# Patient Record
Sex: Female | Born: 1980 | Race: White | Hispanic: No | Marital: Married | Smoking: Never smoker
Health system: Southern US, Community
[De-identification: ages and names within clinical notes are randomized; demographics above are authoritative.]

## PROBLEM LIST (undated history)

## (undated) DIAGNOSIS — IMO0002 Reserved for concepts with insufficient information to code with codable children: Secondary | ICD-10-CM

## (undated) DIAGNOSIS — F419 Anxiety disorder, unspecified: Secondary | ICD-10-CM

## (undated) DIAGNOSIS — C649 Malignant neoplasm of unspecified kidney, except renal pelvis: Secondary | ICD-10-CM

## (undated) DIAGNOSIS — R87619 Unspecified abnormal cytological findings in specimens from cervix uteri: Secondary | ICD-10-CM

## (undated) HISTORY — PX: KIDNEY SURGERY: SHX687

## (undated) HISTORY — DX: Reserved for concepts with insufficient information to code with codable children: IMO0002

## (undated) HISTORY — DX: Malignant neoplasm of unspecified kidney, except renal pelvis: C64.9

## (undated) HISTORY — DX: Unspecified abnormal cytological findings in specimens from cervix uteri: R87.619

---

## 2010-07-30 NOTE — L&D Delivery Note (Signed)
Delivery Note  At 6:59 AM a viable female was delivered via Vaginal, Spontaneous Delivery (Presentation:oa  ;  ).  APGAR:8/9, ; weight .   Placenta status:INTACT , 3 VESSEL.  Cord:  with the following complications: NOINE  Anesthesia: EPIDURAL  Episiotomy:NONE  Lacerations:NONE  Est. Blood Loss (mL): 500  Mom to postpartum.  Baby to nursery-stable.  Kensley Lares S 07/18/2011, 7:10 AM

## 2010-12-27 LAB — HIV ANTIBODY (ROUTINE TESTING W REFLEX): HIV: NONREACTIVE

## 2010-12-27 LAB — HEPATITIS B SURFACE ANTIGEN: Hepatitis B Surface Ag: NEGATIVE

## 2010-12-27 LAB — ABO/RH
RH Type: NEGATIVE
RH Type: POSITIVE

## 2010-12-27 LAB — RUBELLA ANTIBODY, IGM: Rubella: IMMUNE

## 2010-12-27 LAB — GC/CHLAMYDIA PROBE AMP, GENITAL: Gonorrhea: NEGATIVE

## 2011-06-18 ENCOUNTER — Ambulatory Visit: Payer: Self-pay | Admitting: Dietician

## 2011-07-13 ENCOUNTER — Encounter (HOSPITAL_COMMUNITY): Payer: Self-pay | Admitting: *Deleted

## 2011-07-13 ENCOUNTER — Telehealth (HOSPITAL_COMMUNITY): Payer: Self-pay | Admitting: *Deleted

## 2011-07-13 NOTE — Telephone Encounter (Signed)
Preadmission screen  

## 2011-07-16 ENCOUNTER — Telehealth (HOSPITAL_COMMUNITY): Payer: Self-pay | Admitting: *Deleted

## 2011-07-16 NOTE — Telephone Encounter (Signed)
Preadmission screen  

## 2011-07-17 ENCOUNTER — Encounter (HOSPITAL_COMMUNITY): Payer: Self-pay

## 2011-07-17 ENCOUNTER — Inpatient Hospital Stay (HOSPITAL_COMMUNITY)
Admission: RE | Admit: 2011-07-17 | Discharge: 2011-07-20 | DRG: 372 | Disposition: A | Discharge status: Home / Self Care | Payer: BC Managed Care – PPO | Source: Ambulatory Visit | Attending: Obstetrics and Gynecology | Admitting: Obstetrics and Gynecology

## 2011-07-17 DIAGNOSIS — O139 Gestational [pregnancy-induced] hypertension without significant proteinuria, unspecified trimester: Principal | ICD-10-CM | POA: Diagnosis present

## 2011-07-17 LAB — CBC
MCH: 28.6 pg (ref 26.0–34.0)
MCHC: 33.2 g/dL (ref 30.0–36.0)
Platelets: 196 10*3/uL (ref 150–400)

## 2011-07-17 LAB — COMPREHENSIVE METABOLIC PANEL
AST: 18 U/L (ref 0–37)
Albumin: 2.7 g/dL — ABNORMAL LOW (ref 3.5–5.2)
CO2: 21 mEq/L (ref 19–32)
Calcium: 8.7 mg/dL (ref 8.4–10.5)
Creatinine, Ser: 0.76 mg/dL (ref 0.50–1.10)
GFR calc non Af Amer: >90 mL/min (ref >90)

## 2011-07-17 MED ORDER — OXYTOCIN BOLUS FROM INFUSION
500.0000 mL | Freq: Once | INTRAVENOUS | Status: DC
  Filled 2011-07-17: qty 500
  Filled 2011-07-17: qty 1000

## 2011-07-17 MED ORDER — TERBUTALINE SULFATE 1 MG/ML IJ SOLN: 0.2500 mg | Freq: Once | INTRAMUSCULAR | Status: AC | PRN

## 2011-07-17 MED ORDER — FLEET ENEMA 7-19 GM/118ML RE ENEM: 1.0000 | ENEMA | RECTAL | Status: DC | PRN

## 2011-07-17 MED ORDER — ZOLPIDEM TARTRATE 10 MG PO TABS: 10.0000 mg | ORAL_TABLET | Freq: Every evening | ORAL | Status: DC | PRN

## 2011-07-17 MED ORDER — LACTATED RINGERS IV SOLN: 500.0000 mL | INTRAVENOUS | Status: DC | PRN

## 2011-07-17 MED ORDER — OXYTOCIN 20 UNITS IN LACTATED RINGERS INFUSION - SIMPLE: 125.0000 mL/h | Freq: Once | INTRAVENOUS | Status: DC

## 2011-07-17 MED ORDER — LACTATED RINGERS IV SOLN
INTRAVENOUS | Status: DC
  Administered 2011-07-17 – 2011-07-18 (×2): via INTRAVENOUS

## 2011-07-17 MED ORDER — OXYCODONE-ACETAMINOPHEN 5-325 MG PO TABS: 2.0000 | ORAL_TABLET | ORAL | Status: DC | PRN

## 2011-07-17 MED ORDER — CITRIC ACID-SODIUM CITRATE 334-500 MG/5ML PO SOLN: 30.0000 mL | ORAL | Status: DC | PRN

## 2011-07-17 MED ORDER — LIDOCAINE HCL (PF) 1 % IJ SOLN
30.0000 mL | INTRAMUSCULAR | Status: DC | PRN
  Filled 2011-07-17: qty 30

## 2011-07-17 MED ORDER — ACETAMINOPHEN 325 MG PO TABS: 650.0000 mg | ORAL_TABLET | ORAL | Status: DC | PRN

## 2011-07-17 MED ORDER — DINOPROSTONE 10 MG VA INST
10.0000 mg | VAGINAL_INSERT | Freq: Once | VAGINAL | Status: AC
  Administered 2011-07-17: 10 mg via VAGINAL
  Filled 2011-07-17: qty 1

## 2011-07-17 MED ORDER — IBUPROFEN 600 MG PO TABS: 600.0000 mg | ORAL_TABLET | Freq: Four times a day (QID) | ORAL | Status: DC | PRN

## 2011-07-17 MED ORDER — ONDANSETRON HCL 4 MG/2ML IJ SOLN
4.0000 mg | Freq: Four times a day (QID) | INTRAMUSCULAR | Status: DC | PRN
  Administered 2011-07-18: 4 mg via INTRAVENOUS
  Filled 2011-07-17: qty 2

## 2011-07-17 NOTE — H&P (Signed)
Madison Webster is a 30 y.o. female presenting for induction at 37.6.  Elevated blood pressures in office. 150/100 and 148/98 with 2+ protien. PIH labs ok.  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  Episode of SBO with this pregnancy.  Neg GBS History OB History    Grav Para Term Preterm Abortions TAB SAB Ect Mult Living   2 1 1       1      Past Medical History  Diagnosis Date  . Abnormal Pap smear     HPV  . 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 Kidney surgery     L kidney removal   Family History: family history includes Cancer in her mother and Diabetes in her father and paternal grandfather. Social History:  reports that she has never smoked. She has never used smokeless tobacco. She reports that she does not drink alcohol or use illicit drugs.  ROS  Dilation: 2 Effacement (%): 50 Station: -2 Blood pressure 146/103, pulse 68, temperature 98 F (36.7 C), temperature source Oral, resp. rate 18, height 5\' 5"  (1.651 m), weight 73.483 kg (162 lb), last menstrual period 10/26/2010. Maternal Exam:  Uterine Assessment: Contraction strength is mild.  Contraction frequency is irregular.   Abdomen: Patient reports no abdominal tenderness. Fundal height is c/w dates.   Estimated fetal weight is 6+.   Fetal presentation: vertex  Pelvis: adequate for delivery.   Cervix: Cervix evaluated by digital exam.     Physical Exam  Prenatal labs: ABO, Rh: O/Negative/-- (05/30 0000) Antibody: Negative (05/30 0000) Rubella: Immune (05/30 0000) RPR: Nonreactive (05/30 0000)  HBsAg: Negative (05/30 0000)  HIV: Non-reactive (05/30 0000)  GBS: Negative (12/05 0000)   Assessment/Plan: IUP at 37.6 with PIH.  For induction   Torra Pala S 07/17/2011, 8:43 PM

## 2011-07-18 ENCOUNTER — Encounter (HOSPITAL_COMMUNITY): Payer: Self-pay | Admitting: Anesthesiology

## 2011-07-18 ENCOUNTER — Inpatient Hospital Stay (HOSPITAL_COMMUNITY): Payer: BC Managed Care – PPO | Admitting: Anesthesiology

## 2011-07-18 ENCOUNTER — Encounter (HOSPITAL_COMMUNITY): Payer: Self-pay

## 2011-07-18 LAB — CBC
Hemoglobin: 11.7 g/dL — ABNORMAL LOW (ref 12.0–15.0)
MCH: 29 pg (ref 26.0–34.0)
RBC: 4.03 MIL/uL (ref 3.87–5.11)

## 2011-07-18 LAB — RPR: RPR Ser Ql: NONREACTIVE

## 2011-07-18 MED ORDER — FENTANYL 2.5 MCG/ML BUPIVACAINE 1/10 % EPIDURAL INFUSION (WH - ANES)
INTRAMUSCULAR | Status: DC | PRN
  Administered 2011-07-18: 14 mL/h via EPIDURAL

## 2011-07-18 MED ORDER — SIMETHICONE 80 MG PO CHEW: 80.0000 mg | CHEWABLE_TABLET | ORAL | Status: DC | PRN

## 2011-07-18 MED ORDER — TETANUS-DIPHTH-ACELL PERTUSSIS 5-2.5-18.5 LF-MCG/0.5 IM SUSP: 0.5000 mL | Freq: Once | INTRAMUSCULAR | Status: DC

## 2011-07-18 MED ORDER — PHENYLEPHRINE 40 MCG/ML (10ML) SYRINGE FOR IV PUSH (FOR BLOOD PRESSURE SUPPORT)
80.0000 ug | PREFILLED_SYRINGE | INTRAVENOUS | Status: DC | PRN
  Filled 2011-07-18: qty 5

## 2011-07-18 MED ORDER — EPHEDRINE 5 MG/ML INJ
10.0000 mg | INTRAVENOUS | Status: DC | PRN
  Filled 2011-07-18: qty 4

## 2011-07-18 MED ORDER — WITCH HAZEL-GLYCERIN EX PADS: 1.0000 "application " | MEDICATED_PAD | CUTANEOUS | Status: DC | PRN

## 2011-07-18 MED ORDER — BUTORPHANOL TARTRATE 2 MG/ML IJ SOLN
1.0000 mg | Freq: Once | INTRAMUSCULAR | Status: AC
  Administered 2011-07-18: 1 mg via INTRAVENOUS
  Filled 2011-07-18: qty 1

## 2011-07-18 MED ORDER — ONDANSETRON HCL 4 MG PO TABS: 4.0000 mg | ORAL_TABLET | ORAL | Status: DC | PRN

## 2011-07-18 MED ORDER — DIBUCAINE 1 % RE OINT: 1.0000 "application " | TOPICAL_OINTMENT | RECTAL | Status: DC | PRN

## 2011-07-18 MED ORDER — LABETALOL HCL 200 MG PO TABS
200.0000 mg | ORAL_TABLET | Freq: Two times a day (BID) | ORAL | Status: DC
  Administered 2011-07-18 – 2011-07-20 (×5): 200 mg via ORAL
  Filled 2011-07-18 (×5): qty 1

## 2011-07-18 MED ORDER — BENZOCAINE-MENTHOL 20-0.5 % EX AERO
INHALATION_SPRAY | CUTANEOUS | Status: AC
  Filled 2011-07-18: qty 56

## 2011-07-18 MED ORDER — INFLUENZA VIRUS VACC SPLIT PF IM SUSP
0.5000 mL | INTRAMUSCULAR | Status: AC
  Administered 2011-07-19: 0.5 mL via INTRAMUSCULAR
  Filled 2011-07-18: qty 0.5

## 2011-07-18 MED ORDER — ZOLPIDEM TARTRATE 5 MG PO TABS: 5.0000 mg | ORAL_TABLET | Freq: Every evening | ORAL | Status: DC | PRN

## 2011-07-18 MED ORDER — ONDANSETRON HCL 4 MG/2ML IJ SOLN: 4.0000 mg | INTRAMUSCULAR | Status: DC | PRN

## 2011-07-18 MED ORDER — FLEET ENEMA 7-19 GM/118ML RE ENEM: 1.0000 | ENEMA | Freq: Every day | RECTAL | Status: DC | PRN

## 2011-07-18 MED ORDER — PHENYLEPHRINE 40 MCG/ML (10ML) SYRINGE FOR IV PUSH (FOR BLOOD PRESSURE SUPPORT): 80.0000 ug | PREFILLED_SYRINGE | INTRAVENOUS | Status: DC | PRN

## 2011-07-18 MED ORDER — DIPHENHYDRAMINE HCL 50 MG/ML IJ SOLN: 12.5000 mg | INTRAMUSCULAR | Status: DC | PRN

## 2011-07-18 MED ORDER — LANOLIN HYDROUS EX OINT: TOPICAL_OINTMENT | CUTANEOUS | Status: DC | PRN

## 2011-07-18 MED ORDER — PRENATAL MULTIVITAMIN CH
1.0000 | ORAL_TABLET | Freq: Every day | ORAL | Status: DC
  Administered 2011-07-19 – 2011-07-20 (×2): 1 via ORAL
  Filled 2011-07-18 (×2): qty 1

## 2011-07-18 MED ORDER — BISACODYL 10 MG RE SUPP: 10.0000 mg | Freq: Every day | RECTAL | Status: DC | PRN

## 2011-07-18 MED ORDER — DIPHENHYDRAMINE HCL 25 MG PO CAPS: 25.0000 mg | ORAL_CAPSULE | Freq: Four times a day (QID) | ORAL | Status: DC | PRN

## 2011-07-18 MED ORDER — IBUPROFEN 600 MG PO TABS
600.0000 mg | ORAL_TABLET | Freq: Four times a day (QID) | ORAL | Status: DC
  Administered 2011-07-18 – 2011-07-20 (×8): 600 mg via ORAL
  Filled 2011-07-18 (×8): qty 1

## 2011-07-18 MED ORDER — SENNOSIDES-DOCUSATE SODIUM 8.6-50 MG PO TABS
2.0000 | ORAL_TABLET | Freq: Every day | ORAL | Status: DC
  Administered 2011-07-18 – 2011-07-19 (×2): 2 via ORAL

## 2011-07-18 MED ORDER — EPHEDRINE 5 MG/ML INJ: 10.0000 mg | INTRAVENOUS | Status: DC | PRN

## 2011-07-18 MED ORDER — BENZOCAINE-MENTHOL 20-0.5 % EX AERO: 1.0000 "application " | INHALATION_SPRAY | CUTANEOUS | Status: DC | PRN

## 2011-07-18 MED ORDER — OXYCODONE-ACETAMINOPHEN 5-325 MG PO TABS
1.0000 | ORAL_TABLET | ORAL | Status: DC | PRN
  Administered 2011-07-18: 1 via ORAL
  Filled 2011-07-18: qty 1

## 2011-07-18 MED ORDER — LIDOCAINE HCL 1.5 % IJ SOLN
INTRAMUSCULAR | Status: DC | PRN
  Administered 2011-07-18 (×2): 5 mL via EPIDURAL

## 2011-07-18 MED ORDER — FENTANYL 2.5 MCG/ML BUPIVACAINE 1/10 % EPIDURAL INFUSION (WH - ANES)
14.0000 mL/h | INTRAMUSCULAR | Status: DC
  Filled 2011-07-18: qty 60

## 2011-07-18 MED ORDER — LACTATED RINGERS IV SOLN: 500.0000 mL | Freq: Once | INTRAVENOUS | Status: DC

## 2011-07-18 NOTE — Anesthesia Postprocedure Evaluation (Signed)
Anesthesia Post Note  Patient: Madison Webster  Procedure(s) Performed: * No procedures listed *  Anesthesia type: Epidural  Patient location: PACU  Post pain: Pain level controlled  Post assessment: Post-op Vital signs reviewed  Last Vitals:  Filed Vitals:   07/18/11 0805  BP: 128/101  Pulse: 83  Temp:   Resp:     Post vital signs: stable  Level of consciousness: awake  Complications: No apparent anesthesia complications

## 2011-07-18 NOTE — Anesthesia Procedure Notes (Signed)
Epidural Patient location during procedure: OB Start time: 07/18/2011 6:00 AM End time: 07/18/2011 6:05 AM Reason for block: procedure for pain  Staffing Anesthesiologist: Sandrea Hughs Performed by: anesthesiologist   Preanesthetic Checklist Completed: patient identified, site marked, surgical consent, pre-op evaluation, timeout performed, IV checked, risks and benefits discussed and monitors and equipment checked  Epidural Patient position: sitting Prep: site prepped and draped and DuraPrep Patient monitoring: continuous pulse ox and blood pressure Approach: midline Injection technique: LOR air  Needle:  Needle type: Tuohy  Needle gauge: 17 G Needle length: 9 cm Needle insertion depth: 5 cm cm Catheter type: closed end flexible Catheter size: 19 Gauge Catheter at skin depth: 10 cm Test dose: negative and 1.5% lidocaine  Assessment Sensory level: T8 Events: blood not aspirated, injection not painful, no injection resistance, negative IV test and no paresthesia

## 2011-07-18 NOTE — Addendum Note (Signed)
Addendum  created 07/18/11 1716 by Channon Ambrosini   Modules edited:Charges VN, Notes Section    

## 2011-07-18 NOTE — Anesthesia Preprocedure Evaluation (Signed)

## 2011-07-18 NOTE — Anesthesia Postprocedure Evaluation (Signed)
  Anesthesia Post Note  Patient: Madison Webster  Procedure(s) Performed: * No procedures listed *  Anesthesia type: Epidural  Patient location: Mother/Baby  Post pain: Pain level controlled  Post assessment: Post-op Vital signs reviewed  Last Vitals:  Filed Vitals:   07/18/11 1430  BP: 145/76  Pulse: 85  Temp: 36.6 C  Resp: 18    Post vital signs: Reviewed  Level of consciousness:alert  Complications: No apparent anesthesia complications

## 2011-07-18 NOTE — Addendum Note (Signed)
Addendum  created 07/18/11 1716 by Cephus Shelling   Modules edited:Charges VN, Notes Section

## 2011-07-19 LAB — CBC
Hemoglobin: 9.1 g/dL — ABNORMAL LOW (ref 12.0–15.0)
MCH: 29.1 pg (ref 26.0–34.0)
MCHC: 33.5 g/dL (ref 30.0–36.0)
Platelets: 139 10*3/uL — ABNORMAL LOW (ref 150–400)
RDW: 14.1 % (ref 11.5–15.5)

## 2011-07-19 NOTE — Progress Notes (Signed)
Post Partum Day 1 Subjective: no complaints, up ad lib, voiding and denies HA  Objective: Blood pressure 123/77, pulse 66, temperature 97.5 F (36.4 C), temperature source Oral, resp. rate 18, height 5\' 5"  (1.651 m), weight 73.483 kg (162 lb), last menstrual period 10/26/2010, SpO2 99.00%, unknown if currently breastfeeding.  Physical Exam:  General: alert and cooperative Lochia: appropriate Uterine Fundus: firm Perineum intact DVT Evaluation: No evidence of DVT seen on physical exam.   Basename 07/19/11 0515 07/18/11 0503  HGB 9.1* 11.7*  HCT 27.2* 34.6*    Assessment/Plan: Plan for discharge tomorrow   LOS: 2 days   Elvyn Krohn G 07/19/2011, 8:07 AM

## 2011-07-20 LAB — COMPREHENSIVE METABOLIC PANEL
ALT: 10 U/L (ref 0–35)
Albumin: 2.1 g/dL — ABNORMAL LOW (ref 3.5–5.2)
Calcium: 7.8 mg/dL — ABNORMAL LOW (ref 8.4–10.5)
GFR calc Af Amer: >90 mL/min (ref >90)
Glucose, Bld: 85 mg/dL (ref 70–99)
Sodium: 136 mEq/L (ref 135–145)
Total Protein: 4.9 g/dL — ABNORMAL LOW (ref 6.0–8.3)

## 2011-07-20 MED ORDER — IBUPROFEN 600 MG PO TABS: 600.0000 mg | ORAL_TABLET | Freq: Four times a day (QID) | ORAL | Status: AC

## 2011-07-20 MED ORDER — LABETALOL HCL 200 MG PO TABS: 200.0000 mg | ORAL_TABLET | Freq: Two times a day (BID) | ORAL | Status: DC

## 2011-07-20 NOTE — Progress Notes (Signed)
Post Partum Day 2 Subjective: no complaints, up ad lib, voiding, tolerating PO and + flatus. Denies HA ,or  rUQ pain Objective: Blood pressure 144/88, pulse 69, temperature 97.8 F (36.6 C), temperature source Oral, resp. rate 18, height 5\' 5"  (1.651 m), weight 73.483 kg (162 lb), last menstrual period 10/26/2010, SpO2 99.00%, unknown if currently breastfeeding.  Physical Exam:  General: alert and cooperative Lochia: appropriate Uterine Fundus: firm Perineum intact DVT Evaluation: No evidence of DVT seen on physical exam.  DTR"s 1+ no clonus and no pedal edema observed   Basename 07/19/11 0515 07/18/11 0503  HGB 9.1* 11.7*  HCT 27.2* 34.6*    Assessment/Plan: Discharge home CMP ordered prior to discharge. Patient to RTO for BP check in 3 days. Signs and symptoms of PIH reviewed   LOS: 3 days   Deadrian Toya G 07/20/2011, 8:25 AM

## 2011-07-20 NOTE — Discharge Summary (Signed)
Obstetric Discharge Summary Reason for Admission: induction of labor Prenatal Procedures: ultrasound Intrapartum Procedures: spontaneous vaginal delivery Postpartum Procedures: none Complications-Operative and Postpartum: none Hemoglobin  Date Value Range Status  07/19/2011 9.1* 12.0-15.0 (Webster/dL) Final     DELTA CHECK NOTED     REPEATED TO VERIFY     HCT  Date Value Range Status  07/19/2011 27.2* 36.0-46.0 (%) Final    Discharge Diagnoses: Term Pregnancy-delivered  Discharge Information: Date: 07/20/2011 Activity: pelvic rest Diet: routine Medications: PNV, Ibuprofen and labetalol Condition: stable Instructions: refer to practice specific booklet Discharge to: home   Newborn Data: Live born female  Birth Weight: 5 lb 15.6 oz (2710 Webster) APGAR: 8, 9  Home with mother.  Madison Webster 07/20/2011, 8:35 AM

## 2011-07-20 NOTE — Progress Notes (Signed)
Lab results called to Dr. Henderson Cloud. OK to discharge

## 2011-07-24 ENCOUNTER — Inpatient Hospital Stay (HOSPITAL_COMMUNITY): Admission: AD | Admit: 2011-07-24 | Payer: Self-pay | Source: Ambulatory Visit | Admitting: Obstetrics and Gynecology

## 2013-01-28 DIAGNOSIS — Z85528 Personal history of other malignant neoplasm of kidney: Secondary | ICD-10-CM | POA: Insufficient documentation

## 2013-03-24 ENCOUNTER — Other Ambulatory Visit: Payer: Self-pay | Admitting: Obstetrics and Gynecology

## 2013-03-24 DIAGNOSIS — Z803 Family history of malignant neoplasm of breast: Secondary | ICD-10-CM

## 2014-03-22 ENCOUNTER — Other Ambulatory Visit: Payer: Self-pay | Admitting: Internal Medicine

## 2014-03-22 DIAGNOSIS — E041 Nontoxic single thyroid nodule: Secondary | ICD-10-CM

## 2014-04-08 ENCOUNTER — Other Ambulatory Visit: Payer: Self-pay | Admitting: Internal Medicine

## 2014-04-08 ENCOUNTER — Ambulatory Visit
Admission: RE | Admit: 2014-04-08 | Discharge: 2014-04-08 | Disposition: A | Discharge status: Home / Self Care | Payer: BC Managed Care – PPO | Source: Ambulatory Visit | Attending: Internal Medicine | Admitting: Internal Medicine

## 2014-04-08 DIAGNOSIS — E041 Nontoxic single thyroid nodule: Secondary | ICD-10-CM

## 2014-05-31 ENCOUNTER — Encounter (HOSPITAL_COMMUNITY): Payer: Self-pay

## 2016-03-12 DIAGNOSIS — E041 Nontoxic single thyroid nodule: Secondary | ICD-10-CM | POA: Insufficient documentation

## 2017-10-28 ENCOUNTER — Other Ambulatory Visit: Payer: Self-pay | Admitting: Physician Assistant

## 2017-10-28 MED ORDER — ERYTHROMYCIN 5 MG/GM OP OINT: 1.0000 "application " | TOPICAL_OINTMENT | Freq: Three times a day (TID) | OPHTHALMIC | 0 refills | Status: AC

## 2019-05-22 ENCOUNTER — Other Ambulatory Visit: Payer: Self-pay

## 2019-05-22 DIAGNOSIS — Z20822 Contact with and (suspected) exposure to covid-19: Secondary | ICD-10-CM

## 2019-05-25 LAB — NOVEL CORONAVIRUS, NAA: SARS-CoV-2, NAA: NOT DETECTED

## 2019-10-07 DIAGNOSIS — F410 Panic disorder [episodic paroxysmal anxiety] without agoraphobia: Secondary | ICD-10-CM | POA: Insufficient documentation

## 2019-11-16 ENCOUNTER — Emergency Department (HOSPITAL_BASED_OUTPATIENT_CLINIC_OR_DEPARTMENT_OTHER): Payer: No Typology Code available for payment source

## 2019-11-16 ENCOUNTER — Other Ambulatory Visit: Payer: Self-pay

## 2019-11-16 ENCOUNTER — Emergency Department (HOSPITAL_BASED_OUTPATIENT_CLINIC_OR_DEPARTMENT_OTHER)
Admission: EM | Admit: 2019-11-16 | Discharge: 2019-11-16 | Disposition: A | Discharge status: Home / Self Care | Payer: No Typology Code available for payment source | Attending: Emergency Medicine | Admitting: Emergency Medicine

## 2019-11-16 ENCOUNTER — Encounter (HOSPITAL_BASED_OUTPATIENT_CLINIC_OR_DEPARTMENT_OTHER): Payer: Self-pay | Admitting: Emergency Medicine

## 2019-11-16 DIAGNOSIS — Z79899 Other long term (current) drug therapy: Secondary | ICD-10-CM | POA: Diagnosis not present

## 2019-11-16 DIAGNOSIS — H5702 Anisocoria: Secondary | ICD-10-CM | POA: Diagnosis not present

## 2019-11-16 DIAGNOSIS — M542 Cervicalgia: Secondary | ICD-10-CM | POA: Insufficient documentation

## 2019-11-16 DIAGNOSIS — R519 Headache, unspecified: Secondary | ICD-10-CM | POA: Diagnosis not present

## 2019-11-16 DIAGNOSIS — R6884 Jaw pain: Secondary | ICD-10-CM | POA: Diagnosis present

## 2019-11-16 HISTORY — DX: Anxiety disorder, unspecified: F41.9

## 2019-11-16 LAB — BASIC METABOLIC PANEL
Anion gap: 9 (ref 5–15)
BUN: 9 mg/dL (ref 6–20)
CO2: 26 mmol/L (ref 22–32)
Calcium: 8.5 mg/dL — ABNORMAL LOW (ref 8.9–10.3)
Chloride: 102 mmol/L (ref 98–111)
Creatinine, Ser: 0.96 mg/dL (ref 0.44–1.00)
GFR calc Af Amer: >60 mL/min (ref >60)
GFR calc non Af Amer: >60 mL/min (ref >60)
Glucose, Bld: 153 mg/dL — ABNORMAL HIGH (ref 70–99)
Potassium: 3.5 mmol/L (ref 3.5–5.1)
Sodium: 137 mmol/L (ref 135–145)

## 2019-11-16 LAB — PREGNANCY, URINE: Preg Test, Ur: NEGATIVE

## 2019-11-16 MED ORDER — IOHEXOL 350 MG/ML SOLN
100.0000 mL | Freq: Once | INTRAVENOUS | Status: AC
  Administered 2019-11-16: 100 mL via INTRAVENOUS

## 2019-11-16 MED ORDER — PROCHLORPERAZINE EDISYLATE 10 MG/2ML IJ SOLN
10.0000 mg | Freq: Once | INTRAMUSCULAR | Status: AC
  Administered 2019-11-16: 18:00:00 10 mg via INTRAVENOUS
  Filled 2019-11-16: qty 2

## 2019-11-16 MED ORDER — DIPHENHYDRAMINE HCL 50 MG/ML IJ SOLN
25.0000 mg | Freq: Once | INTRAMUSCULAR | Status: AC
  Administered 2019-11-16: 18:00:00 25 mg via INTRAVENOUS
  Filled 2019-11-16: qty 1

## 2019-11-16 MED ORDER — KETOROLAC TROMETHAMINE 30 MG/ML IJ SOLN
15.0000 mg | Freq: Once | INTRAMUSCULAR | Status: AC
  Administered 2019-11-16: 20:00:00 15 mg via INTRAVENOUS
  Filled 2019-11-16: qty 1

## 2019-11-16 NOTE — ED Provider Notes (Signed)
[facility] Provider Note   CSN: [id number] Arrival date & time: 11/16/19  [id number]     History Chief Complaint  Patient presents with  . Jaw Pain    Madison Webster is a 39 y.o. female.  39 yo F with a cc of headache.  This is left-sided is described as throbbing.  Nothing seems to make it better or worse.  She denies head injury denies loss consciousness denies fever denies one-sided numbness or weakness denies difficulty with speech or swallowing.  She feels that the headache is about the left temple seems to go down into the jaw and then down into the upper part of the anterior neck on the left side.  Denies any difficulty with vision or blurry vision.  She tried to call her family doctor and dentist but was unable to be seen today.  She also has had left-sided tinnitus for about a month or so.  The history is provided by the patient.  Illness Severity:  Moderate Onset quality:  Gradual Duration:  2 days Timing:  Constant Progression:  Worsening Chronicity:  New Associated symptoms: headaches   Associated symptoms: no chest pain, no congestion, no fever, no myalgias, no nausea, no rhinorrhea, no shortness of breath, no vomiting and no wheezing        Past Medical History:  Diagnosis Date  . Abnormal Pap smear    HPV  . Anxiety   . Recurrent Wilms' tumor of kidney [facility])    removed L kidney    Patient Active Problem List   Diagnosis Date Noted  . SVD (spontaneous vaginal delivery) 07/18/2011    Past Surgical History:  Procedure Laterality Date  . KIDNEY SURGERY     L kidney removal     OB History    Gravida  2   Para  2   Term  2   Preterm      AB      Living  2     SAB      TAB      Ectopic      Multiple      Live Births  2           Family History  Problem Relation Age of Onset  . Diabetes Father   . Diabetes Paternal Grandfather   . Cancer Mother        breast    Social History   Tobacco Use  .  Smoking status: Never Smoker  . Smokeless tobacco: Never Used  Substance Use Topics  . Alcohol use: No  . Drug use: No    Home Medications Prior to Admission medications   Medication Sig Start Date End Date Taking? Authorizing Provider  calcium carbonate (TUMS - DOSED IN MG ELEMENTAL CALCIUM) 500 MG chewable tablet Chew 2 tablets by mouth daily as needed. Patient used this medication for heartburn.     [provider]  flintstones complete (FLINTSTONES) 60 MG chewable tablet Chew 1 tablet by mouth daily.      [provider]  labetalol (NORMODYNE) 200 MG tablet Take 1 tablet (200 mg total) by mouth 2 (two) times daily. 07/20/11 07/19/12  Juanda Chance, NP    Allergies    Patient has no known allergies.  Review of Systems   Review of Systems  Constitutional: Negative for chills and fever.  HENT: Negative for congestion and rhinorrhea.   Eyes: Negative for redness and visual disturbance.  Respiratory: Negative for shortness of  breath and wheezing.   Cardiovascular: Negative for chest pain and palpitations.  Gastrointestinal: Negative for nausea and vomiting.  Genitourinary: Negative for dysuria and urgency.  Musculoskeletal: Negative for arthralgias and myalgias.  Skin: Negative for pallor and wound.  Neurological: Positive for headaches. Negative for dizziness.    Physical Exam Updated Vital Signs BP 131/82   Pulse 69   Temp 98.1 F (36.7 C) (Oral)   Resp 20   Ht 5\' 5"  (1.651 m)   LMP 10/27/2019   SpO2 99%   BMI 26.96 kg/m   Physical Exam Vitals and nursing note reviewed.  Constitutional:      General: She is not in acute distress.    Appearance: She is well-developed. She is not diaphoretic.  HENT:     Head: Normocephalic and atraumatic.     Ears:     Comments: Left TM with anterior scarring and posterior changes that look chronic.    Mouth/Throat:     Comments: Mild caries to the left first molar.  Nontender to percussion.  No edema no  fluctuance no pain.  Tolerate secretions without difficulty.  Rotates her head without tenderness. Eyes:     Pupils: Pupils are equal, round, and reactive to light.     Comments: Anisocoria left pupil is 7 right is a 5  Cardiovascular:     Rate and Rhythm: Normal rate and regular rhythm.     Heart sounds: No murmur. No friction rub. No gallop.   Pulmonary:     Effort: Pulmonary effort is normal.     Breath sounds: No wheezing or rales.  Abdominal:     General: There is no distension.     Palpations: Abdomen is soft.     Tenderness: There is no abdominal tenderness.  Musculoskeletal:        General: No tenderness.     Cervical back: Normal range of motion and neck supple.     Comments: No appreciable C-spine tenderness.  No pain to the base of the skull.  Skin:    General: Skin is warm and dry.  Neurological:     Mental Status: She is alert and oriented to person, place, and time.     GCS: GCS eye subscore is 4. GCS verbal subscore is 5. GCS motor subscore is 6.     Cranial Nerves: Cranial nerves are intact.     Sensory: Sensation is intact.     Motor: Motor function is intact.     Coordination: Coordination is intact.     Gait: Gait is intact.  Psychiatric:        Behavior: Behavior normal.     ED Results / Procedures / Treatments   Labs (all labs ordered are listed, but only abnormal results are displayed) Labs Reviewed  BASIC METABOLIC PANEL - Abnormal; Notable for the following components:      Result Value   Glucose, Bld 153 (*)    Calcium 8.5 (*)    All other components within normal limits  PREGNANCY, URINE    EKG EKG Interpretation  Date/Time:  Monday November 16 2019 16:46:18 EDT Ventricular Rate:  58 PR Interval:    QRS Duration: 84 QT Interval:  416 QTC Calculation: 409 R Axis:   84 Text Interpretation: Sinus arrhythmia No significant change since last tracing Confirmed by Deno Etienne [phone]) on 11/16/2019 4:51:03 PM   Radiology CT Angio Head W or Wo  Contrast  Result Date: 11/16/2019 CLINICAL DATA:  Left neck and jaw pain.  Suspicion carotid artery dissection. EXAM: CT ANGIOGRAPHY HEAD AND NECK TECHNIQUE: Multidetector CT imaging of the head and neck was performed using the standard protocol during bolus administration of intravenous contrast. Multiplanar CT image reconstructions and MIPs were obtained to evaluate the vascular anatomy. Carotid stenosis measurements (when applicable) are obtained utilizing NASCET criteria, using the distal internal carotid diameter as the denominator. CONTRAST:  176mL OMNIPAQUE IOHEXOL 350 MG/ML SOLN COMPARISON:  None. FINDINGS: CT HEAD FINDINGS Brain: The brain shows a normal appearance without evidence of malformation, atrophy, old or acute small or large vessel infarction, mass lesion, hemorrhage, hydrocephalus or extra-axial collection. Vascular: No hyperdense vessel. No evidence of atherosclerotic calcification. Skull: Normal. No traumatic finding. No focal bone lesion. Sinuses/Orbits: Sinuses are clear. Orbits appear normal. Mastoids are clear. Other: None significant CTA NECK FINDINGS Aortic arch: Normal. Normal branching pattern. Right carotid system: Normal. No bifurcation disease. No right dissection. Left carotid system: Normal. No bifurcation disease. No left dissection to explain the clinical presentation. Vertebral arteries: Normal. No vertebral dissection or other pathology. Skeleton: Mild midcervical spondylosis. Other neck: No mass or lymphadenopathy. Thyroid gland has a slightly lobular configuration without evidence of a focal mass lesion. Upper chest: Normal Review of the MIP images confirms the above findings CTA HEAD FINDINGS Anterior circulation: Both internal carotid arteries are widely patent through the skull base and siphon regions. The anterior and middle cerebral vessels are normal without proximal stenosis, aneurysm or vascular malformation. Posterior circulation: Vertebral arteries widely patent to  the basilar. No basilar stenosis. Posterior circulation branch vessels are. Venous sinuses: Patent and normal. Anatomic variants: None significant. Review of the MIP images confirms the above findings IMPRESSION: 1. Normal CT angiography of the neck and head. No evidence of left carotid dissection to explain the clinical presentation. 2. Mild midcervical spondylosis. 3. Normal head CT Electronically Signed   By: Nelson Chimes M.D.   On: 11/16/2019 19:09   CT Angio Neck W and/or Wo Contrast  Result Date: 11/16/2019 CLINICAL DATA:  Left neck and jaw pain. Suspicion carotid artery dissection. EXAM: CT ANGIOGRAPHY HEAD AND NECK TECHNIQUE: Multidetector CT imaging of the head and neck was performed using the standard protocol during bolus administration of intravenous contrast. Multiplanar CT image reconstructions and MIPs were obtained to evaluate the vascular anatomy. Carotid stenosis measurements (when applicable) are obtained utilizing NASCET criteria, using the distal internal carotid diameter as the denominator. CONTRAST:  192mL OMNIPAQUE IOHEXOL 350 MG/ML SOLN COMPARISON:  None. FINDINGS: CT HEAD FINDINGS Brain: The brain shows a normal appearance without evidence of malformation, atrophy, old or acute small or large vessel infarction, mass lesion, hemorrhage, hydrocephalus or extra-axial collection. Vascular: No hyperdense vessel. No evidence of atherosclerotic calcification. Skull: Normal. No traumatic finding. No focal bone lesion. Sinuses/Orbits: Sinuses are clear. Orbits appear normal. Mastoids are clear. Other: None significant CTA NECK FINDINGS Aortic arch: Normal. Normal branching pattern. Right carotid system: Normal. No bifurcation disease. No right dissection. Left carotid system: Normal. No bifurcation disease. No left dissection to explain the clinical presentation. Vertebral arteries: Normal. No vertebral dissection or other pathology. Skeleton: Mild midcervical spondylosis. Other neck: No mass or  lymphadenopathy. Thyroid gland has a slightly lobular configuration without evidence of a focal mass lesion. Upper chest: Normal Review of the MIP images confirms the above findings CTA HEAD FINDINGS Anterior circulation: Both internal carotid arteries are widely patent through the skull base and siphon regions. The anterior and middle cerebral vessels are normal without proximal stenosis, aneurysm or vascular malformation. Posterior  circulation: Vertebral arteries widely patent to the basilar. No basilar stenosis. Posterior circulation branch vessels are. Venous sinuses: Patent and normal. Anatomic variants: None significant. Review of the MIP images confirms the above findings IMPRESSION: 1. Normal CT angiography of the neck and head. No evidence of left carotid dissection to explain the clinical presentation. 2. Mild midcervical spondylosis. 3. Normal head CT Electronically Signed   By: Nelson Chimes M.D.   On: 11/16/2019 19:09    Procedures Procedures (including critical care time)  Medications Ordered in ED Medications  prochlorperazine (COMPAZINE) injection 10 mg (10 mg Intravenous Given 11/16/19 1801)  diphenhydrAMINE (BENADRYL) injection 25 mg (25 mg Intravenous Given 11/16/19 1800)  iohexol (OMNIPAQUE) 350 MG/ML injection 100 mL (100 mLs Intravenous Contrast Given 11/16/19 1852)  ketorolac (TORADOL) 30 MG/ML injection 15 mg (15 mg Intravenous Given 11/16/19 1937)    ED Course  I have reviewed the triage vital signs and the nursing notes.  Pertinent labs & imaging results that were available during my care of the patient were reviewed by me and considered in my medical decision making (see chart for details).    MDM Rules/Calculators/A&P                      39 yo F with a chief complaints of a left-sided headache.  This been going on since yesterday.  Nontraumatic no fevers.  She does have anisocoria which may be chronic for her but due to this will obtain CT imaging.  She has a headache  cocktail.  Reassess.  Patient had some improvement with headache cocktail but had some recurrence of her headache upon reassessment post imaging results.  We will give her a dose of Toradol here.  PCP follow-up.  7:41 PM:  I have discussed the diagnosis/risks/treatment options with the patient and believe the pt to be eligible for discharge home to follow-up with PCP. We also discussed returning to the ED immediately if new or worsening sx occur. We discussed the sx which are most concerning (e.g., sudden worsening pain, fever, inability to tolerate by mouth) that necessitate immediate return. Medications administered to the patient during their visit and any new prescriptions provided to the patient are listed below.  Medications given during this visit Medications  prochlorperazine (COMPAZINE) injection 10 mg (10 mg Intravenous Given 11/16/19 1801)  diphenhydrAMINE (BENADRYL) injection 25 mg (25 mg Intravenous Given 11/16/19 1800)  iohexol (OMNIPAQUE) 350 MG/ML injection 100 mL (100 mLs Intravenous Contrast Given 11/16/19 1852)  ketorolac (TORADOL) 30 MG/ML injection 15 mg (15 mg Intravenous Given 11/16/19 1937)     The patient appears reasonably screen and/or stabilized for discharge and I doubt any other medical condition or other [facility] requiring further screening, evaluation, or treatment in the ED at this time prior to discharge.   Final Clinical Impression(s) / ED Diagnoses Final diagnoses:  Left-sided headache    Rx / DC Orders ED Discharge Orders    None       Deno Etienne, DO 11/16/19 1941

## 2019-11-16 NOTE — ED Triage Notes (Signed)
L jaw pain since yesterday. Denies dental issues.

## 2019-11-16 NOTE — Progress Notes (Signed)
CT pending IV  

## 2019-11-16 NOTE — Discharge Instructions (Signed)
Try tylenol and motrin for pain.  Follow up with your doctor in the office they may want to have you follow-up with a neurologist or dentist.  Please return for worsening pain one-sided numbness or weakness difficulty with speech or swallowing.

## 2019-12-03 DIAGNOSIS — D649 Anemia, unspecified: Secondary | ICD-10-CM | POA: Insufficient documentation

## 2020-09-26 ENCOUNTER — Other Ambulatory Visit: Payer: Self-pay

## 2020-09-26 ENCOUNTER — Emergency Department (HOSPITAL_BASED_OUTPATIENT_CLINIC_OR_DEPARTMENT_OTHER): Payer: No Typology Code available for payment source

## 2020-09-26 ENCOUNTER — Emergency Department (HOSPITAL_BASED_OUTPATIENT_CLINIC_OR_DEPARTMENT_OTHER)
Admission: EM | Admit: 2020-09-26 | Discharge: 2020-09-26 | Disposition: A | Discharge status: Home / Self Care | Payer: No Typology Code available for payment source | Attending: Emergency Medicine | Admitting: Emergency Medicine

## 2020-09-26 ENCOUNTER — Encounter (HOSPITAL_BASED_OUTPATIENT_CLINIC_OR_DEPARTMENT_OTHER): Payer: Self-pay | Admitting: Emergency Medicine

## 2020-09-26 DIAGNOSIS — Z8616 Personal history of COVID-19: Secondary | ICD-10-CM | POA: Diagnosis not present

## 2020-09-26 DIAGNOSIS — R0789 Other chest pain: Secondary | ICD-10-CM | POA: Insufficient documentation

## 2020-09-26 DIAGNOSIS — R0602 Shortness of breath: Secondary | ICD-10-CM | POA: Insufficient documentation

## 2020-09-26 LAB — CBC WITH DIFFERENTIAL/PLATELET
Abs Immature Granulocytes: 0.03 10*3/uL (ref 0.00–0.07)
Basophils Absolute: 0 10*3/uL (ref 0.0–0.1)
Basophils Relative: 0 %
Eosinophils Absolute: 0.2 10*3/uL (ref 0.0–0.5)
Eosinophils Relative: 2 %
HCT: 35.2 % — ABNORMAL LOW (ref 36.0–46.0)
Hemoglobin: 11.6 g/dL — ABNORMAL LOW (ref 12.0–15.0)
Immature Granulocytes: 0 %
Lymphocytes Relative: 24 %
Lymphs Abs: 2.4 10*3/uL (ref 0.7–4.0)
MCH: 29.3 pg (ref 26.0–34.0)
MCHC: 33 g/dL (ref 30.0–36.0)
MCV: 88.9 fL (ref 80.0–100.0)
Monocytes Absolute: 0.9 10*3/uL (ref 0.1–1.0)
Monocytes Relative: 9 %
Neutro Abs: 6.7 10*3/uL (ref 1.7–7.7)
Neutrophils Relative %: 65 %
Platelets: 314 10*3/uL (ref 150–400)
RBC: 3.96 MIL/uL (ref 3.87–5.11)
RDW: 12.4 % (ref 11.5–15.5)
WBC: 10.2 10*3/uL (ref 4.0–10.5)
nRBC: 0 % (ref 0.0–0.2)

## 2020-09-26 LAB — COMPREHENSIVE METABOLIC PANEL
ALT: 16 U/L (ref 0–44)
AST: 19 U/L (ref 15–41)
Albumin: 4.1 g/dL (ref 3.5–5.0)
Alkaline Phosphatase: 73 U/L (ref 38–126)
Anion gap: 10 (ref 5–15)
BUN: 13 mg/dL (ref 6–20)
CO2: 26 mmol/L (ref 22–32)
Calcium: 8.8 mg/dL — ABNORMAL LOW (ref 8.9–10.3)
Chloride: 102 mmol/L (ref 98–111)
Creatinine, Ser: 0.83 mg/dL (ref 0.44–1.00)
GFR, Estimated: >60 mL/min (ref >60)
Glucose, Bld: 102 mg/dL — ABNORMAL HIGH (ref 70–99)
Potassium: 3.6 mmol/L (ref 3.5–5.1)
Sodium: 138 mmol/L (ref 135–145)
Total Bilirubin: 0.3 mg/dL (ref 0.3–1.2)
Total Protein: 7 g/dL (ref 6.5–8.1)

## 2020-09-26 LAB — TROPONIN I (HIGH SENSITIVITY): Troponin I (High Sensitivity): 3 ng/L (ref <18)

## 2020-09-26 LAB — D-DIMER, QUANTITATIVE: D-Dimer, Quant: 0.5 ug/mL-FEU (ref 0.00–0.50)

## 2020-09-26 LAB — LIPASE, BLOOD: Lipase: 48 U/L (ref 11–51)

## 2020-09-26 MED ORDER — HYDROCODONE-ACETAMINOPHEN 5-325 MG PO TABS
1.0000 | ORAL_TABLET | Freq: Once | ORAL | Status: DC
  Filled 2020-09-26: qty 1

## 2020-09-26 MED ORDER — KETOROLAC TROMETHAMINE 15 MG/ML IJ SOLN
30.0000 mg | Freq: Once | INTRAMUSCULAR | Status: AC
  Administered 2020-09-26: 30 mg via INTRAVENOUS
  Filled 2020-09-26: qty 2

## 2020-09-26 MED ORDER — NAPROXEN 500 MG PO TABS: 500.0000 mg | ORAL_TABLET | Freq: Two times a day (BID) | ORAL | 0 refills | Status: DC

## 2020-09-26 NOTE — ED Triage Notes (Signed)
Reports pain that started a few days ago in the right ribcage and radiated to right chest.  Now reports it is in the center of the chest and radiates into her back.  Also endorses feeling sob with the pain.

## 2020-09-26 NOTE — ED Provider Notes (Signed)
[facility]ERGENC[facility] DEPARTMENT Provider Note   CSN: [id number] Arrival date & time: 09/26/20  0113     History Chief Complaint  Patient presents with  . Chest Pain    Madison Webster is a 40 y.o. female.  HPI     This is a 40 year old female with a history of Wilms tumor status post nephrectomy who presents with chest pain.  Patient reports several day history of recurrent right and mid chest discomfort.  She describes it as sharp and at times radiating into her back.  It is worse with deep breathing and certain movements.  She currently rates her pain at 6 out of 10.  She has some associated shortness of breath.  No recent fevers or cough.  She did test positive for COVID-19 1 month ago.  She is fully vaccinated and boosted.  She is not noted any lower extremity swelling.  None history of coronary artery disease, hypertension, hyperlipidemia.  She denies any exertional nature of the symptoms.  She denies any worsening symptoms with food intake.  No urinary symptoms.   Past Medical History:  Diagnosis Date  . Abnormal Pap smear    HPV  . Anxiety   . Recurrent Wilms' tumor of kidney [facility])    removed L kidney    Patient Active Problem List   Diagnosis Date Noted  . SVD (spontaneous vaginal delivery) 07/18/2011    Past Surgical History:  Procedure Laterality Date  . KIDNEY SURGERY     L kidney removal     OB History    Gravida  2   Para  2   Term  2   Preterm      AB      Living  2     SAB      IAB      Ectopic      Multiple      Live Births  2           Family History  Problem Relation Age of Onset  . Diabetes Father   . Diabetes Paternal Grandfather   . Cancer Mother        breast    Social History   Tobacco Use  . Smoking status: Never Smoker  . Smokeless tobacco: Never Used  Substance Use Topics  . Alcohol use: No  . Drug use: No    Home Medications Prior to Admission medications   Medication Sig Start Date End  Date Taking? Authorizing Provider  naproxen (NAPROSYN) 500 MG tablet Take 1 tablet (500 mg total) by mouth 2 (two) times daily. 09/26/20  Yes Akon Reinoso, Barbette Hair, MD  calcium carbonate (TUMS - DOSED IN MG ELEMENTAL CALCIUM) 500 MG chewable tablet Chew 2 tablets by mouth daily as needed. Patient used this medication for heartburn.     [provider]  flintstones complete (FLINTSTONES) 60 MG chewable tablet Chew 1 tablet by mouth daily.      [provider]  labetalol (NORMODYNE) 200 MG tablet Take 1 tablet (200 mg total) by mouth 2 (two) times daily. 07/20/11 07/19/12  Juanda Chance, NP    Allergies    Patient has no known allergies.  Review of Systems   Review of Systems  Constitutional: Negative for fever.  Respiratory: Positive for shortness of breath.   Cardiovascular: Positive for chest pain. Negative for leg swelling.  Gastrointestinal: Negative for abdominal pain, nausea and vomiting.  Genitourinary: Negative for dysuria.  All other systems reviewed and are  negative.   Physical Exam Updated Vital Signs BP 115/81 (BP Location: Right Arm)   Pulse (!) 47   Temp 97.8 F (36.6 C) (Oral)   Resp 11   Ht 1.651 m (5\' 5" )   Wt 68 kg   SpO2 100%   BMI 24.96 kg/m   Physical Exam Vitals and nursing note reviewed.  Constitutional:      Appearance: She is well-developed and well-nourished.  HENT:     Head: Normocephalic and atraumatic.  Eyes:     Pupils: Pupils are equal, round, and reactive to light.  Cardiovascular:     Rate and Rhythm: Normal rate and regular rhythm.     Heart sounds: Normal heart sounds.  Pulmonary:     Effort: Pulmonary effort is normal. No respiratory distress.     Breath sounds: No wheezing.     Comments: No respiratory distress, patient does seem to splint when taking deep breaths Abdominal:     General: Bowel sounds are normal.     Palpations: Abdomen is soft.  Musculoskeletal:     Cervical back: Neck supple.     Right lower leg:  No tenderness. No edema.     Left lower leg: No tenderness. No edema.  Skin:    General: Skin is warm and dry.  Neurological:     Mental Status: She is alert and oriented to person, place, and time.  Psychiatric:        Mood and Affect: Mood and affect and mood normal.     ED Results / Procedures / Treatments   Labs (all labs ordered are listed, but only abnormal results are displayed) Labs Reviewed  CBC WITH DIFFERENTIAL/PLATELET - Abnormal; Notable for the following components:      Result Value   Hemoglobin 11.6 (*)    HCT 35.2 (*)    All other components within normal limits  COMPREHENSIVE METABOLIC PANEL - Abnormal; Notable for the following components:   Glucose, Bld 102 (*)    Calcium 8.8 (*)    All other components within normal limits  LIPASE, BLOOD  D-DIMER, QUANTITATIVE  TROPONIN I (HIGH SENSITIVITY)    EKG EKG Interpretation  Date/Time:  Monday September 26 2020 01:19:42 EST Ventricular Rate:  62 PR Interval:    QRS Duration: 92 QT Interval:  396 QTC Calculation: 403 R Axis:   86 Text Interpretation: Sinus rhythm Confirmed by Thayer Jew [phone]) on 09/26/2020 1:21:29 AM   Radiology DG Chest 2 View  Result Date: 09/26/2020 CLINICAL DATA:  Chest pain EXAM: CHEST - 2 VIEW COMPARISON:  None. FINDINGS: The heart size and mediastinal contours are within normal limits. Both lungs are clear. The visualized skeletal structures are unremarkable. IMPRESSION: No active cardiopulmonary disease. Electronically Signed   By: Prudencio Pair M.D.   On: 09/26/2020 02:06    Procedures Procedures   Medications Ordered in ED Medications  HYDROcodone-acetaminophen (NORCO/VICODIN) 5-325 MG per tablet 1 tablet (1 tablet Oral Not Given 09/26/20 0137)  ketorolac (TORADOL) 15 MG/ML injection 30 mg (30 mg Intravenous Given 09/26/20 0235)    ED Course  I have reviewed the triage vital signs and the nursing notes.  Pertinent labs & imaging results that were available during my  care of the patient were reviewed by me and considered in my medical decision making (see chart for details).    MDM Rules/Calculators/A&P  Patient presents with right and mid chest pain.  She is overall nontoxic and vital signs are reassuring.  She is satting 100% on room air.  She is in no respiratory distress but does occasionally splint with a deep breath.  She is low risk for ACS.  EKG without acute ischemic or arrhythmic changes.  Recent COVID-19 but otherwise low risk for PE.  Given nature of pain and pleuritic symptoms, will screen with a D-dimer.  Other considerations include but not limited to, pneumothorax, pneumonia, GI pathology only with food related symptoms.  Abdominal exam is benign.  Labs obtained.  Troponin negative.  Feel this is sufficient given atypical history and ongoing nature of symptoms.  D-dimer is reassuring at 0.5.  Additional liver function tests and lipase are normal which argues against such entities as pancreatitis or cholecystitis with a normal abdominal exam.  Patient was given anti-inflammatories.  On recheck, she does report symptoms improvement of symptoms.  It does appear that she is having some pleurisy or chest wall discomfort but work-up is reassuring for acute emergent process.  Recommend anti-inflammatories for the next 3 to 5 days and close primary care follow-up.  After history, exam, and medical workup I feel the patient has been appropriately medically screened and is safe for discharge home. Pertinent diagnoses were discussed with the patient. Patient was given return precautions.  Final Clinical Impression(s) / ED Diagnoses Final diagnoses:  Atypical chest pain    Rx / DC Orders ED Discharge Orders         Ordered    naproxen (NAPROSYN) 500 MG tablet  2 times daily        09/26/20 0240           Kileen Lange, Barbette Hair, MD 09/26/20 [phone]

## 2020-09-26 NOTE — Discharge Instructions (Addendum)
Your work-up today is reassuring including heart testing and screening for blood clots.  Take naproxen twice daily.  Monitor symptoms closely.  If not improving in 2 to 3 days, follow-up with your primary physician.

## 2020-11-17 LAB — HM PAP SMEAR: HM Pap smear: NORMAL

## 2020-11-24 LAB — HM PAP SMEAR: HM Pap smear: NEGATIVE

## 2021-05-26 ENCOUNTER — Other Ambulatory Visit: Payer: Self-pay | Admitting: Physician Assistant

## 2021-05-26 MED ORDER — MUPIROCIN 2 % EX OINT: TOPICAL_OINTMENT | CUTANEOUS | 0 refills | Status: DC

## 2021-05-26 NOTE — Progress Notes (Signed)
Impetigo of right nare. Bactroban sent.

## 2021-10-25 IMAGING — CT CT ANGIO HEAD
1 of 13 series · 3 of 33 positions shown · IV contrast (omnipaque)
Comparison: None.

CLINICAL DATA: Left neck and jaw pain. Suspicion carotid artery
dissection.

EXAM:
CT ANGIOGRAPHY HEAD AND NECK
TECHNIQUE: Multidetector CT imaging of the head and neck was performed using
the standard protocol during bolus administration of intravenous
contrast. Multiplanar CT image reconstructions and MIPs were
obtained to evaluate the vascular anatomy. Carotid stenosis
measurements (when applicable) are obtained utilizing NASCET
criteria, using the distal internal carotid diameter as the
denominator.
CONTRAST:  100mL OMNIPAQUE IOHEXOL 350 MG/ML SOLN

[Series 11: axial thin · axial · 0.39mm/px · z∈[+817,+1138]mm · 3 of 331 slices shown]
[im 1/331  soft-tissue]
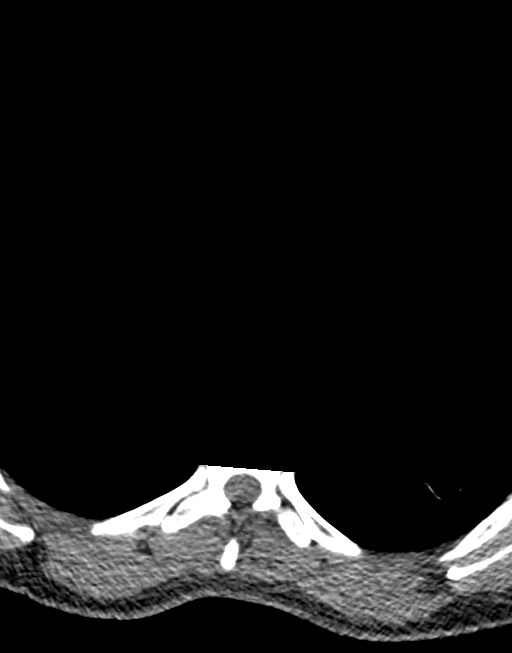
[im 166/331  bone]
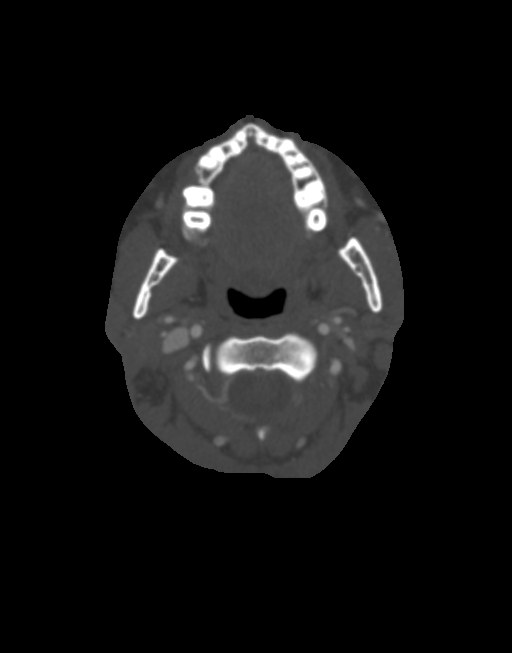
[im 331/331  soft-tissue]
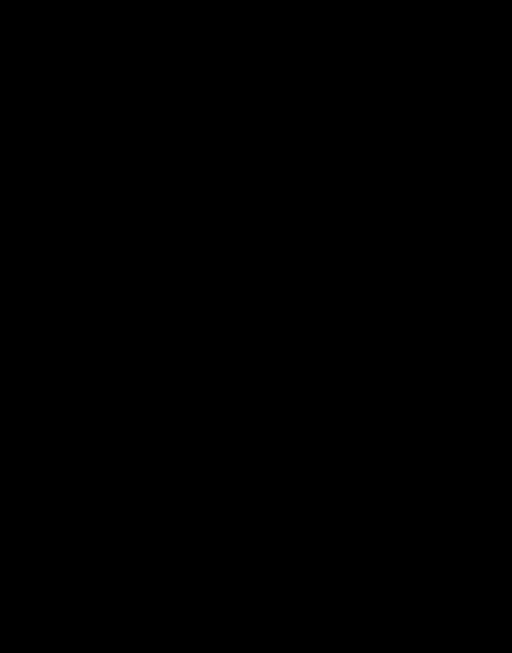

[3 of 33 positions shown; findings below may reference images not displayed]

FINDINGS: CT HEAD FINDINGS

Brain: The brain shows a normal appearance without evidence of
malformation, atrophy, old or acute small or large vessel
infarction, mass lesion, hemorrhage, hydrocephalus or extra-axial
collection.

Vascular: No hyperdense vessel. No evidence of atherosclerotic
calcification.

Skull: Normal. No traumatic finding. No focal bone lesion.

Sinuses/Orbits: Sinuses are clear. Orbits appear normal. Mastoids
are clear.

Other: None significant

CTA NECK FINDINGS

Aortic arch: Normal. Normal branching pattern.

Right carotid system: Normal. No bifurcation disease. No right
dissection.

Left carotid system: Normal. No bifurcation disease. No left
dissection to explain the clinical presentation.

Vertebral arteries: Normal. No vertebral dissection or other
pathology.

Skeleton: Mild midcervical spondylosis.

Other neck: No mass or lymphadenopathy. Thyroid gland has a slightly
lobular configuration without evidence of a focal mass lesion.

Upper chest: Normal

Review of the MIP images confirms the above findings

CTA HEAD FINDINGS

Anterior circulation: Both internal carotid arteries are widely
patent through the skull base and siphon regions. The anterior and
middle cerebral vessels are normal without proximal stenosis,
aneurysm or vascular malformation.

Posterior circulation: Vertebral arteries widely patent to the
basilar. No basilar stenosis. Posterior circulation branch vessels
are.

Venous sinuses: Patent and normal.

Anatomic variants: None significant.

Review of the MIP images confirms the above findings
IMPRESSION: 1. Normal CT angiography of the neck and head. No evidence of left
carotid dissection to explain the clinical presentation.
2. Mild midcervical spondylosis.
3. Normal head CT

## 2022-04-23 ENCOUNTER — Other Ambulatory Visit: Payer: Self-pay | Admitting: Physician Assistant

## 2022-04-23 DIAGNOSIS — B360 Pityriasis versicolor: Secondary | ICD-10-CM

## 2022-04-23 MED ORDER — FLUCONAZOLE 200 MG PO TABS: 200.0000 mg | ORAL_TABLET | ORAL | 0 refills | Status: DC

## 2022-04-23 NOTE — Progress Notes (Signed)
Pt has hypopigmented rash on right shoulder and upper back. She admits to sweating a lot and working out at [employer].  Advised to use OTC selsun blue and diflucan weekly for the next 2 weeks.

## 2022-07-09 ENCOUNTER — Telehealth: Payer: Self-pay | Admitting: Physician Assistant

## 2022-07-09 MED ORDER — AMOXICILLIN-POT CLAVULANATE 875-125 MG PO TABS: 1.0000 | ORAL_TABLET | Freq: Two times a day (BID) | ORAL | 0 refills | Status: DC

## 2022-07-09 NOTE — Telephone Encounter (Signed)
3 weeks of cough, congestion, sinus pressure and drainage. No fever. Negative for covid. Lots of pressure. Cough is mildy productive.  Augmentin sent sinobronchitis.

## 2022-09-05 IMAGING — DX DG CHEST 2V
2 series · 2 of 2 positions shown · non-contrast
Comparison: None.

CLINICAL DATA: Chest pain

EXAM:
CHEST - 2 VIEW

[chest pa]
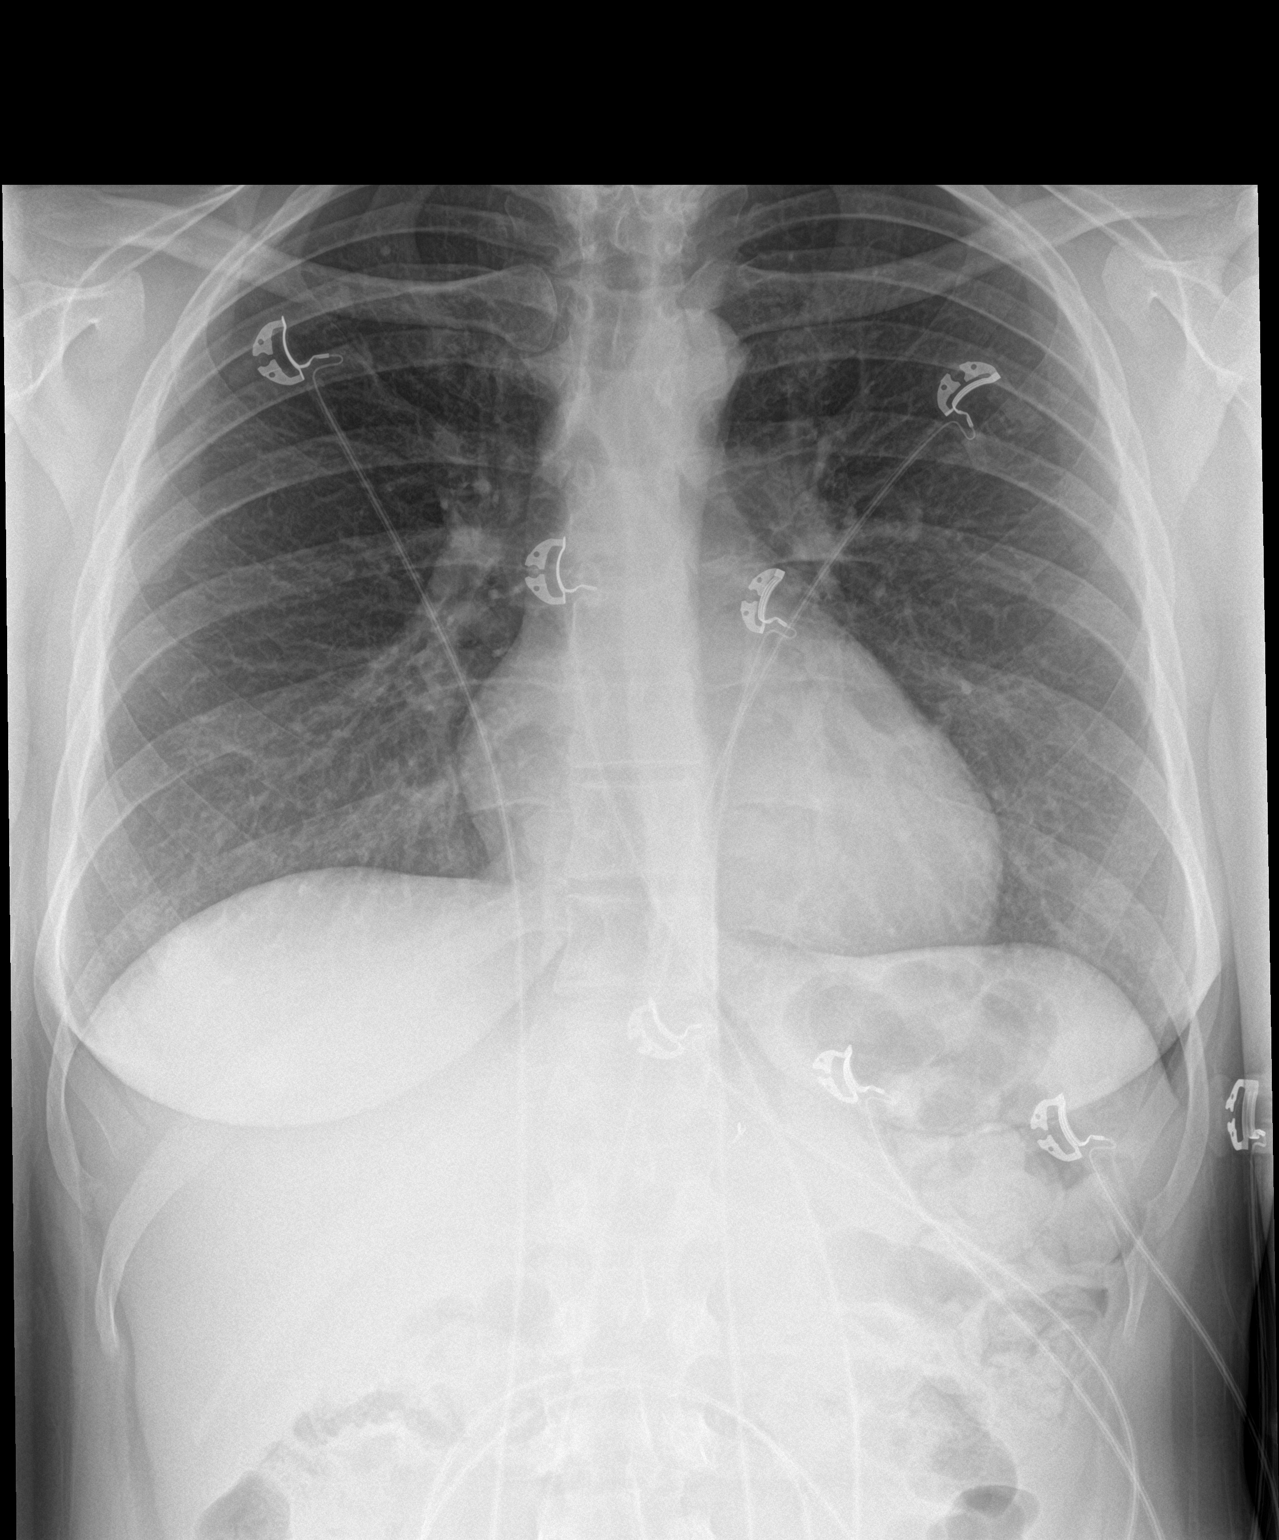

[chest lat]
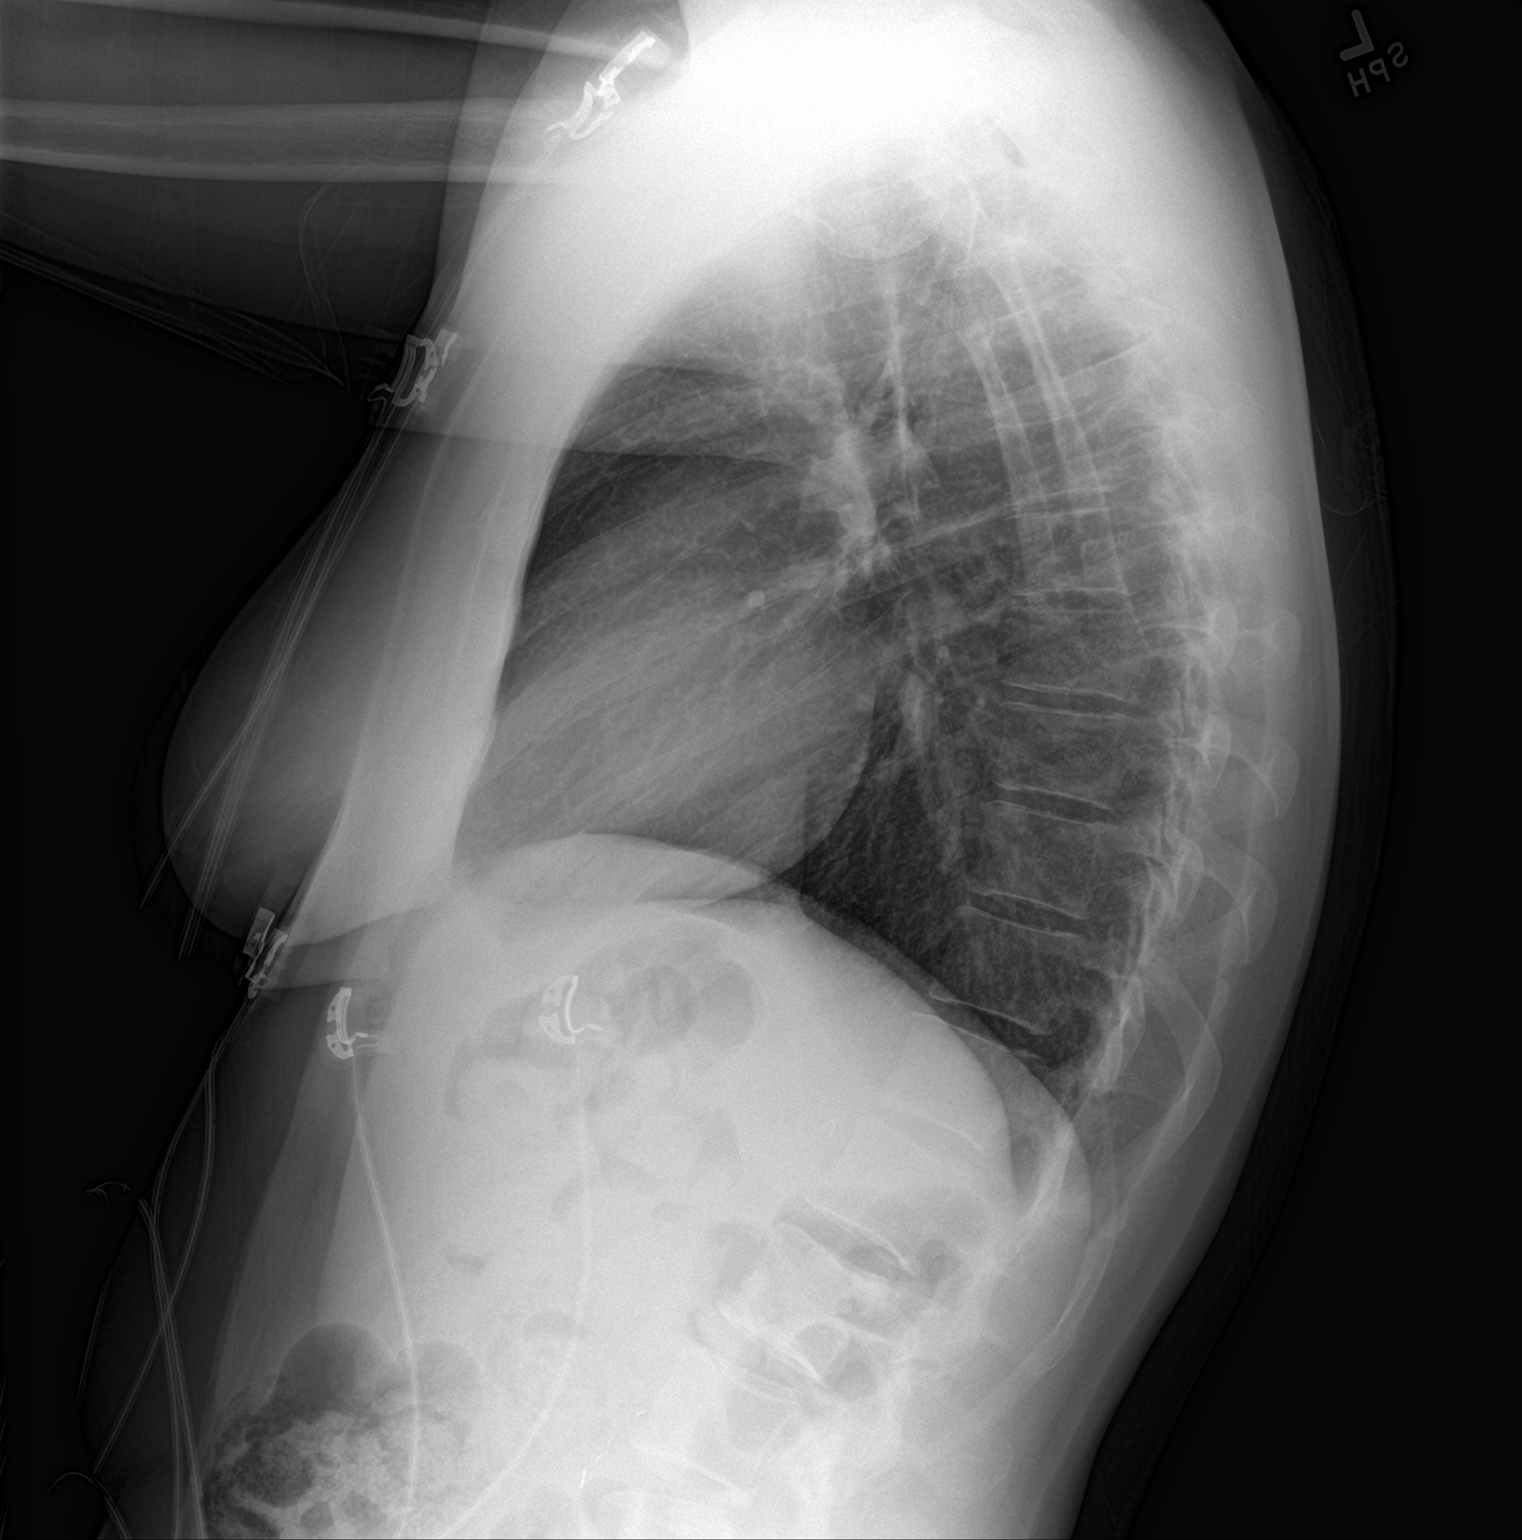

[2 of 2 positions shown; findings below may reference images not displayed]

FINDINGS: The heart size and mediastinal contours are within normal limits.
Both lungs are clear. The visualized skeletal structures are
unremarkable.
IMPRESSION: No active cardiopulmonary disease.

## 2022-12-06 ENCOUNTER — Ambulatory Visit: Payer: No Typology Code available for payment source | Admitting: Family Medicine

## 2022-12-12 ENCOUNTER — Telehealth: Payer: Self-pay | Admitting: Physician Assistant

## 2022-12-12 MED ORDER — FLUCONAZOLE 150 MG PO TABS: 150.0000 mg | ORAL_TABLET | Freq: Once | ORAL | 0 refills | Status: AC

## 2022-12-12 MED ORDER — MUPIROCIN 2 % EX OINT: TOPICAL_OINTMENT | CUTANEOUS | 0 refills | Status: DC

## 2022-12-12 MED ORDER — AMOXICILLIN-POT CLAVULANATE 875-125 MG PO TABS: 1.0000 | ORAL_TABLET | Freq: Two times a day (BID) | ORAL | 0 refills | Status: DC

## 2022-12-12 NOTE — Telephone Encounter (Signed)
Pt has establish care appt when she gets back from study abroad trip.  Sent augmentin/bactroban/diflucan to use as needed while traveling.

## 2023-01-03 ENCOUNTER — Ambulatory Visit: Payer: No Typology Code available for payment source | Admitting: Family Medicine

## 2023-01-29 ENCOUNTER — Encounter: Payer: Self-pay | Admitting: Family Medicine

## 2023-01-29 ENCOUNTER — Ambulatory Visit (INDEPENDENT_AMBULATORY_CARE_PROVIDER_SITE_OTHER): Payer: No Typology Code available for payment source | Admitting: Family Medicine

## 2023-01-29 VITALS — BP 127/78 | HR 54 | Ht 65.0 in | Wt 162.0 lb

## 2023-01-29 DIAGNOSIS — Z85528 Personal history of other malignant neoplasm of kidney: Secondary | ICD-10-CM

## 2023-01-29 DIAGNOSIS — E041 Nontoxic single thyroid nodule: Secondary | ICD-10-CM

## 2023-01-29 DIAGNOSIS — D649 Anemia, unspecified: Secondary | ICD-10-CM | POA: Diagnosis not present

## 2023-01-29 DIAGNOSIS — N816 Rectocele: Secondary | ICD-10-CM | POA: Insufficient documentation

## 2023-01-29 DIAGNOSIS — F411 Generalized anxiety disorder: Secondary | ICD-10-CM | POA: Diagnosis not present

## 2023-01-29 DIAGNOSIS — F410 Panic disorder [episodic paroxysmal anxiety] without agoraphobia: Secondary | ICD-10-CM

## 2023-01-29 LAB — CBC: MCV: 82.4 fL (ref 80.0–100.0)

## 2023-01-29 NOTE — Patient Instructions (Signed)
Can consider tapering the paroxetine to half a tab every other day and a whole tab in between.  If you are doing well after 6 weeks then okay to drop down to half a tab daily.  I would just recommend a very slow taper so if you start noticing some symptoms creeping and you can always go back up.  Let us know when you need refills.  Otherwise follow-up in 6 months if you are otherwise doing well.  If you are able to taper completely off of the medication then please just follow-up in 1 year.

## 2023-01-29 NOTE — Progress Notes (Signed)
New Patient Office Visit  Subjective    Patient ID: Madison Webster, female    DOB: [birth year]  Age: 42 y.o. MRN: [id number]  CC:  Chief Complaint  Patient presents with   Establish Care    HPI Madison Webster presents to establish care She has a history of a Wilms tumor that was surgically removed around age 8.  She followed with oncology for almost 30 years.  She has always had normal renal function.  She also has a history of anxiety including panic disorder and generalized anxiety disorder.  She is currently on Paxil 20 mg daily and has done really well on the medication she has been on it for about 18 months at this point.  She does have a Mirena IUD and for birth control she has 2 children who are 36 and 40 and is not interested in any additional pregnancies at this point in time.  She also has a history of a rectocele and is managing that with trying to keep her stools soft.  She has done pelvic PT before which was helpful. She also has a family history of breast cancer in her mother and possibly maternal grandmother and so she started yearly mammograms at age 46.  She follows at [facility] in [place] for her mammograms and Pap smears.  Outpatient Encounter Medications as of 01/29/2023  Medication Sig   levonorgestrel (MIRENA) 20 MCG/DAY IUD 1 each by Intrauterine route once.   PARoxetine (PAXIL) 20 MG tablet Take 20 mg by mouth daily.   [DISCONTINUED] amoxicillin-clavulanate (AUGMENTIN) 875-125 MG tablet Take 1 tablet by mouth 2 (two) times daily.   [DISCONTINUED] calcium carbonate (TUMS - DOSED IN MG ELEMENTAL CALCIUM) 500 MG chewable tablet Chew 2 tablets by mouth daily as needed. Patient used this medication for heartburn.    [DISCONTINUED] flintstones complete (FLINTSTONES) 60 MG chewable tablet Chew 1 tablet by mouth daily.     [DISCONTINUED] labetalol (NORMODYNE) 200 MG tablet Take 1 tablet (200 mg total) by mouth 2 (two) times daily.   [DISCONTINUED]  mupirocin ointment (BACTROBAN) 2 % Apply to affected area TID for 7 days as needed for skin infection.   No facility-administered encounter medications on file as of 01/29/2023.    Past Medical History:  Diagnosis Date   Abnormal Pap smear    HPV   Anxiety    Recurrent Wilms' tumor of kidney (HCC)    removed L kidney   SVD (spontaneous vaginal delivery) 07/18/2011    Past Surgical History:  Procedure Laterality Date   KIDNEY SURGERY     L kidney removal    Family History  Problem Relation Age of Onset   Cancer Mother    Breast cancer Mother    Diabetes Father    Diabetes Paternal Grandfather     Social History   Socioeconomic History   Marital status: Married    Spouse name: Not on file   Number of children: Not on file   Years of education: Not on file   Highest education level: Not on file  Occupational History   Not on file  Tobacco Use   Smoking status: Never   Smokeless tobacco: Never  Substance and Sexual Activity   Alcohol use: No   Drug use: No   Sexual activity: Yes  Other Topics Concern   Not on file  Social History Narrative   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  She has 2 children.   Social Determinants  of Health   Financial Resource Strain: Not on file  Food Insecurity: Not on file  Transportation Needs: Not on file  Physical Activity: Not on file  Stress: Not on file  Social Connections: Not on file  Intimate Partner Violence: Not on file    Review of Systems  All other systems reviewed and are negative.       Objective    BP 127/78   Pulse (!) 54   Ht 5\' 5"  (1.651 m)   Wt 162 lb (73.5 kg)   LMP 01/15/2023 (Exact Date)   SpO2 100%   BMI 26.96 kg/m   Physical Exam Vitals and nursing note reviewed.  Constitutional:      Appearance: She is well-developed.  HENT:     Head: Normocephalic and atraumatic.  Cardiovascular:     Rate and Rhythm: Normal rate and regular rhythm.     Heart sounds: Normal heart sounds.   Pulmonary:     Effort: Pulmonary effort is normal.     Breath sounds: Normal breath sounds.  Skin:    General: Skin is warm and dry.  Neurological:     Mental Status: She is alert and oriented to person, place, and time.  Psychiatric:        Behavior: Behavior normal.         Assessment & Plan:   Problem List Items Addressed This Visit       Digestive   Rectocele    Currently managing it by keeping constipation under control.  Has undergone pelvic PT in the past.        Endocrine   Thyroid nodule - Primary    Red nodule was noted on the medical history so we will also get an up-to-date TSH just to make sure that it is normal.      Relevant Orders   COMPLETE METABOLIC PANEL WITH GFR   TSH   CBC     Other   History of Wilms' tumor    Continue to follow renal function yearly we will get up-to-date labs.      Relevant Orders   COMPLETE METABOLIC PANEL WITH GFR   TSH   CBC   Generalized anxiety disorder with panic attacks    Continue paroxetine.  We did discuss considering at some point adjusting the dose or tapering off especially since she has been doing really well.  There are pros and cons to that.  She is concerned about the possibility of weight gain.  She could certainly start with taking a half versus 1 at alternating every other day and if she does well then decrease down to half a tab daily.  If at any point she is noticing any shift in mood then recommend increase dose back to 20 mg daily she does often take an extra half a tab right around her.  As she notices that sometimes her anxiety increases around that time.      Relevant Medications   PARoxetine (PAXIL) 20 MG tablet   Other Relevant Orders   COMPLETE METABOLIC PANEL WITH GFR   TSH   CBC   Anemia   Relevant Orders   COMPLETE METABOLIC PANEL WITH GFR   TSH   CBC    No follow-ups on file.   Nani Gasser, MD  I spent 25 minutes on the day of the encounter to include pre-visit record  review, face-to-face time with the patient and post visit ordering of test.

## 2023-01-29 NOTE — Assessment & Plan Note (Signed)
Red nodule was noted on the medical history so we will also get an up-to-date TSH just to make sure that it is normal.

## 2023-01-29 NOTE — Assessment & Plan Note (Signed)
Currently managing it by keeping constipation under control.  Has undergone pelvic PT in the past.

## 2023-01-29 NOTE — Assessment & Plan Note (Signed)
Continue to follow renal function yearly we will get up-to-date labs.

## 2023-01-29 NOTE — Assessment & Plan Note (Signed)
Continue paroxetine.  We did discuss considering at some point adjusting the dose or tapering off especially since she has been doing really well.  There are pros and cons to that.  She is concerned about the possibility of weight gain.  She could certainly start with taking a half versus 1 at alternating every other day and if she does well then decrease down to half a tab daily.  If at any point she is noticing any shift in mood then recommend increase dose back to 20 mg daily she does often take an extra half a tab right around her.  As she notices that sometimes her anxiety increases around that time.

## 2023-01-30 LAB — CBC
HCT: 37.5 % (ref 35.0–45.0)
MPV: 10.5 fL (ref 7.5–12.5)
WBC: 9.3 10*3/uL (ref 3.8–10.8)

## 2023-01-30 NOTE — Progress Notes (Signed)
Davin, I went ahead and checked a CBC on your labs because I had noticed an old diagnosis of anemia.  You do still have some mild anemia with a hemoglobin around 11.6.  It looks like it was pretty similar couple years ago.  I am going to have the lab add an iron panel to see if maybe your iron stores are little on the low side.  Your metabolic panel including kidney function liver and calcium look great.  I would looks great as well.

## 2023-02-02 LAB — CBC
Hemoglobin: 11.6 g/dL — ABNORMAL LOW (ref 11.7–15.5)
MCH: 25.5 pg — ABNORMAL LOW (ref 27.0–33.0)
MCHC: 30.9 g/dL — ABNORMAL LOW (ref 32.0–36.0)
Platelets: 277 10*3/uL (ref 140–400)
RBC: 4.55 10*6/uL (ref 3.80–5.10)
RDW: 14.7 % (ref 11.0–15.0)

## 2023-02-02 LAB — COMPLETE METABOLIC PANEL WITH GFR
AG Ratio: 1.9 (calc) (ref 1.0–2.5)
ALT: 13 U/L (ref 6–29)
AST: 15 U/L (ref 10–30)
Albumin: 4.5 g/dL (ref 3.6–5.1)
Alkaline phosphatase (APISO): 73 U/L (ref 31–125)
BUN: 10 mg/dL (ref 7–25)
CO2: 28 mmol/L (ref 20–32)
Calcium: 9.4 mg/dL (ref 8.6–10.2)
Chloride: 103 mmol/L (ref 98–110)
Creat: 0.87 mg/dL (ref 0.50–0.99)
Globulin: 2.4 g/dL (calc) (ref 1.9–3.7)
Glucose, Bld: 89 mg/dL (ref 65–99)
Potassium: 4.4 mmol/L (ref 3.5–5.3)
Sodium: 139 mmol/L (ref 135–146)
Total Bilirubin: 0.5 mg/dL (ref 0.2–1.2)
Total Protein: 6.9 g/dL (ref 6.1–8.1)
eGFR: 86 mL/min/{1.73_m2} (ref >=60)

## 2023-02-02 LAB — IRON,TIBC AND FERRITIN PANEL
%SAT: 29 % (calc) (ref 16–45)
Ferritin: 4 ng/mL — ABNORMAL LOW (ref 16–154)
Iron: 138 ug/dL (ref 40–190)
TIBC: 472 mcg/dL (calc) — ABNORMAL HIGH (ref 250–450)

## 2023-02-02 LAB — TSH: TSH: 1.39 m[IU]/L

## 2023-02-04 NOTE — Progress Notes (Signed)
Hi Madison Webster, we had them add on an iron panel.  Your serum iron looks good but your iron stores, also called ferritin, is low.  We like to have that number greater than 40.  So would really like to have you start taking an iron supplement.  If you have tried these in the past and they cause her stomach to hurt you can also try a plant-based or vegan iron.  These tend to be a little bit more gentle.  In addition I want you to work on improving iron intake naturally through your diet such as green leafy vegetables such as spinach and kale, beets, lentils and beans, eggs, fish etc.  Like to check that ferritin and again in about 3 months to make sure that it is improving.

## 2023-03-29 ENCOUNTER — Ambulatory Visit (INDEPENDENT_AMBULATORY_CARE_PROVIDER_SITE_OTHER): Payer: No Typology Code available for payment source | Admitting: Family Medicine

## 2023-03-29 ENCOUNTER — Encounter: Payer: Self-pay | Admitting: Family Medicine

## 2023-03-29 VITALS — BP 121/82 | HR 71 | Ht 65.0 in | Wt 156.0 lb

## 2023-03-29 DIAGNOSIS — Z23 Encounter for immunization: Secondary | ICD-10-CM

## 2023-03-29 DIAGNOSIS — Z0000 Encounter for general adult medical examination without abnormal findings: Secondary | ICD-10-CM | POA: Diagnosis not present

## 2023-03-29 NOTE — Progress Notes (Signed)
Complete physical exam  Patient: Madison Webster   DOB: Mar 20, 1981   42 y.o. Female  MRN: [id number]  Subjective:    Chief Complaint  Patient presents with   Annual Exam    Madison Webster is a 42 y.o. female who presents today for a complete physical exam. She reports consuming a general diet.  Exercises 6 days per week.    She generally feels well. . She does not have additional problems to discuss today.   Sees gyn for Pap smear.  Has Mirena in place.  Will call for pap results.   Occ ear ringing in her right ear. Notices it more when she feels anxious.    Most recent fall risk assessment:    01/29/2023   10:00 AM  Fall Risk   Falls in the past year? 0  Number falls in past yr: 0  Injury with Fall? 0  Risk for fall due to : No Fall Risks  Follow up Falls evaluation completed     Most recent depression screenings:    01/29/2023   10:00 AM  PHQ 2/9 Scores  PHQ - 2 Score 0  PHQ- 9 Score 2        Patient Care Team: Agapito Games, MD as PCP - General (Family Medicine) Zelphia Cairo, MD as Consulting Physician (Obstetrics and Gynecology)   Outpatient Medications Prior to Visit  Medication Sig   levonorgestrel (MIRENA) 20 MCG/DAY IUD 1 each by Intrauterine route once.   PARoxetine (PAXIL) 20 MG tablet Take 20 mg by mouth daily.   No facility-administered medications prior to visit.    ROS      Objective:     BP 121/82   Pulse 71   Ht 5\' 5"  (1.651 m)   Wt 156 lb (70.8 kg)   SpO2 99%   BMI 25.96 kg/m    Physical Exam Vitals and nursing note reviewed.  Constitutional:      Appearance: Normal appearance.  HENT:     Head: Normocephalic and atraumatic.     Right Ear: Tympanic membrane, ear canal and external ear normal. There is no impacted cerumen.     Left Ear: Tympanic membrane, ear canal and external ear normal. There is no impacted cerumen.     Nose: Nose normal.     Mouth/Throat:     Pharynx: Oropharynx is clear.  Eyes:      Extraocular Movements: Extraocular movements intact.     Conjunctiva/sclera: Conjunctivae normal.     Pupils: Pupils are equal, round, and reactive to light.  Neck:     Thyroid: No thyromegaly.  Cardiovascular:     Rate and Rhythm: Normal rate and regular rhythm.  Pulmonary:     Effort: Pulmonary effort is normal.     Breath sounds: Normal breath sounds.  Abdominal:     General: Bowel sounds are normal.     Palpations: Abdomen is soft.     Tenderness: There is no abdominal tenderness.  Musculoskeletal:        General: No swelling.     Cervical back: Neck supple. No tenderness.  Lymphadenopathy:     Cervical: No cervical adenopathy.  Skin:    General: Skin is warm and dry.  Neurological:     Mental Status: She is alert and oriented to person, place, and time.  Psychiatric:        Mood and Affect: Mood normal.        Behavior: Behavior normal.  Results for orders placed or performed in visit on 03/29/23  HM PAP SMEAR  Result Value Ref Range   HM Pap smear normal. done with GYN        Assessment & Plan:    Routine Health Maintenance and Physical Exam  Immunization History  Administered Date(s) Administered   Influenza Split 07/19/2011   Influenza, Seasonal, Injecte, Preservative Fre 03/29/2023   Influenza-Unspecified 04/30/2019   Tdap 12/20/2020   Unspecified SARS-COV-2 Vaccination 10/03/2019, 11/12/2019    Health Maintenance  Topic Date Due   Hepatitis C Screening  Never done   COVID-19 Vaccine (3 - Mixed Product risk series) 12/10/2019   PAP SMEAR-Modifier  11/18/2023   DTaP/Tdap/Td (2 - Td or Tdap) 12/21/2030   INFLUENZA VACCINE  Completed   HIV Screening  Completed   Pneumococcal Vaccine 25-26 Years old  Aged Out   HPV VACCINES  Aged Out    Discussed health benefits of physical activity, and encouraged her to engage in regular exercise appropriate for her age and condition.  Problem List Items Addressed This Visit   None Visit Diagnoses      Wellness examination    -  Primary   Encounter for immunization       Relevant Orders   Flu vaccine trivalent PF, 6mos and older(Flulaval,Afluria,Fluarix,Fluzone) (Completed)      No follow-ups on file. Keep up a regular exercise program and make sure you are eating a healthy diet Try to eat 4 servings of dairy a day, or if you are lactose intolerant take a calcium with vitamin D daily.  Your vaccines are up to date.      Nani Gasser, MD

## 2023-04-18 ENCOUNTER — Encounter: Payer: Self-pay | Admitting: Family Medicine

## 2023-07-19 ENCOUNTER — Other Ambulatory Visit: Payer: Self-pay | Admitting: Physician Assistant

## 2023-07-19 DIAGNOSIS — L700 Acne vulgaris: Secondary | ICD-10-CM | POA: Insufficient documentation

## 2023-07-19 MED ORDER — SPIRONOLACTONE 25 MG PO TABS: 25.0000 mg | ORAL_TABLET | Freq: Every day | ORAL | 3 refills | Status: DC

## 2023-07-19 MED ORDER — TRETINOIN 0.1 % EX CREA: TOPICAL_CREAM | Freq: Every day | CUTANEOUS | 1 refills | Status: AC

## 2023-07-19 NOTE — Progress Notes (Signed)
Hormonal acne around jaw line.

## 2023-10-22 ENCOUNTER — Other Ambulatory Visit: Payer: Self-pay | Admitting: Physician Assistant

## 2023-10-22 DIAGNOSIS — K581 Irritable bowel syndrome with constipation: Secondary | ICD-10-CM | POA: Insufficient documentation

## 2023-10-22 MED ORDER — LINACLOTIDE 290 MCG PO CAPS: 290.0000 ug | ORAL_CAPSULE | Freq: Every day | ORAL | 3 refills | Status: AC

## 2023-10-22 NOTE — Progress Notes (Signed)
 Pt has had constipation for years since teenager. She has tried miralax, colace, stool softeners, drinking more water, caffeine to have regular bowel movements.   Trial of linzess.

## 2023-12-10 ENCOUNTER — Other Ambulatory Visit: Payer: Self-pay | Admitting: Physician Assistant

## 2023-12-10 DIAGNOSIS — F410 Panic disorder [episodic paroxysmal anxiety] without agoraphobia: Secondary | ICD-10-CM

## 2023-12-10 DIAGNOSIS — Z7184 Encounter for health counseling related to travel: Secondary | ICD-10-CM

## 2023-12-10 MED ORDER — AMOXICILLIN-POT CLAVULANATE 875-125 MG PO TABS: 1.0000 | ORAL_TABLET | Freq: Two times a day (BID) | ORAL | 0 refills | Status: AC

## 2023-12-10 MED ORDER — PAROXETINE HCL 20 MG PO TABS: 20.0000 mg | ORAL_TABLET | Freq: Every day | ORAL | 0 refills | Status: DC

## 2023-12-10 MED ORDER — ALPRAZOLAM 0.5 MG PO TABS: 0.5000 mg | ORAL_TABLET | Freq: Two times a day (BID) | ORAL | 0 refills | Status: AC | PRN

## 2023-12-10 NOTE — Progress Notes (Signed)
 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 She would like antibiotic to use just in case. She needs xanax to use as needed for flying and acute anxiety.  Sent to pharmacy.

## 2024-03-07 ENCOUNTER — Other Ambulatory Visit: Payer: Self-pay | Admitting: Physician Assistant

## 2024-03-10 ENCOUNTER — Telehealth: Payer: Self-pay

## 2024-03-10 NOTE — Telephone Encounter (Signed)
 Left message requesting recent mammogram.

## 2024-07-16 ENCOUNTER — Other Ambulatory Visit: Payer: Self-pay | Admitting: Physician Assistant

## 2024-08-10 ENCOUNTER — Other Ambulatory Visit: Payer: Self-pay | Admitting: Physician Assistant

## 2024-08-10 MED ORDER — AMOXICILLIN-POT CLAVULANATE 875-125 MG PO TABS: 1.0000 | ORAL_TABLET | Freq: Two times a day (BID) | ORAL | 0 refills | Status: AC

## 2024-08-10 NOTE — Progress Notes (Signed)
 Pt calls in with sinus pressure, cough, drainage for over 2 weeks. No fever, chills, body aches. Taking OTC flonase and dayquil and nyquil with minimal relief. Having more sinus pressure over the last few days.   Sent augmentin  for 10 days.
